# Patient Record
Sex: Male | Born: 1955 | Race: Black or African American | Hispanic: No | Marital: Single
Health system: Southern US, Community
[De-identification: ages and names within clinical notes are randomized; demographics above are authoritative.]

---

## 2006-04-10 ENCOUNTER — Other Ambulatory Visit: Payer: Self-pay

## 2006-04-10 ENCOUNTER — Emergency Department: Payer: Self-pay | Admitting: Internal Medicine

## 2008-08-29 ENCOUNTER — Ambulatory Visit: Payer: Self-pay | Admitting: Specialist

## 2010-12-13 ENCOUNTER — Emergency Department: Payer: Self-pay | Admitting: Emergency Medicine

## 2011-03-05 ENCOUNTER — Ambulatory Visit: Payer: Self-pay | Admitting: Internal Medicine

## 2011-07-01 ENCOUNTER — Ambulatory Visit: Payer: Self-pay | Admitting: Orthopedic Surgery

## 2011-10-08 ENCOUNTER — Emergency Department: Payer: Self-pay | Admitting: *Deleted

## 2011-10-09 ENCOUNTER — Emergency Department: Payer: Self-pay | Admitting: Emergency Medicine

## 2011-10-09 LAB — BASIC METABOLIC PANEL
Anion Gap: 8 (ref 7–16)
Calcium, Total: 9 mg/dL (ref 8.5–10.1)
Chloride: 107 mmol/L (ref 98–107)
Co2: 28 mmol/L (ref 21–32)
Creatinine: 0.88 mg/dL (ref 0.60–1.30)
EGFR (African American): >60
EGFR (Non-African Amer.): >60
Glucose: 91 mg/dL (ref 65–99)
Osmolality: 284 (ref 275–301)
Potassium: 4.3 mmol/L (ref 3.5–5.1)
Sodium: 143 mmol/L (ref 136–145)

## 2011-10-09 LAB — CBC WITH DIFFERENTIAL/PLATELET
Basophil #: 0 10*3/uL (ref 0.0–0.1)
Eosinophil %: 0.1 %
HCT: 38.2 % — ABNORMAL LOW (ref 40.0–52.0)
Lymphocyte #: 1.5 10*3/uL (ref 1.0–3.6)
Lymphocyte %: 30 %
MCV: 89 fL (ref 80–100)
Monocyte #: 0.4 x10 3/mm (ref 0.2–1.0)
Monocyte %: 8.3 %
Neutrophil #: 3 10*3/uL (ref 1.4–6.5)
Platelet: 161 10*3/uL (ref 150–440)
RBC: 4.31 10*6/uL — ABNORMAL LOW (ref 4.40–5.90)
WBC: 5 10*3/uL (ref 3.8–10.6)

## 2011-10-09 LAB — URINALYSIS, COMPLETE
Bilirubin,UR: NEGATIVE
Blood: NEGATIVE
Nitrite: NEGATIVE
Ph: 6 (ref 4.5–8.0)
Protein: NEGATIVE
Specific Gravity: 1.012 (ref 1.003–1.030)
Squamous Epithelial: 1

## 2011-11-04 ENCOUNTER — Emergency Department: Payer: Self-pay | Admitting: Internal Medicine

## 2011-11-11 ENCOUNTER — Emergency Department: Payer: Self-pay | Admitting: Emergency Medicine

## 2011-12-01 ENCOUNTER — Emergency Department: Payer: Self-pay | Admitting: *Deleted

## 2011-12-20 ENCOUNTER — Emergency Department: Payer: Self-pay | Admitting: Unknown Physician Specialty

## 2011-12-24 ENCOUNTER — Ambulatory Visit: Payer: Self-pay | Admitting: Neurology

## 2012-01-27 ENCOUNTER — Inpatient Hospital Stay: Payer: Self-pay | Admitting: Internal Medicine

## 2012-01-27 LAB — URINALYSIS, COMPLETE
Bacteria: NONE SEEN
Bilirubin,UR: NEGATIVE
Blood: NEGATIVE
Glucose,UR: NEGATIVE mg/dL (ref 0–75)
Ketone: NEGATIVE
Leukocyte Esterase: NEGATIVE
Nitrite: NEGATIVE
Ph: 6 (ref 4.5–8.0)
Protein: NEGATIVE
RBC,UR: NONE SEEN /HPF (ref 0–5)
Specific Gravity: 1.014 (ref 1.003–1.030)
Squamous Epithelial: 1
WBC UR: 1 /HPF (ref 0–5)

## 2012-01-27 LAB — CBC
HCT: 40.1 % (ref 40.0–52.0)
HGB: 13.1 g/dL (ref 13.0–18.0)
MCH: 29.2 pg (ref 26.0–34.0)
MCHC: 32.5 g/dL (ref 32.0–36.0)
MCV: 90 fL (ref 80–100)
Platelet: 247 10*3/uL (ref 150–440)
RBC: 4.48 10*6/uL (ref 4.40–5.90)
RDW: 14.4 % (ref 11.5–14.5)
WBC: 5.2 10*3/uL (ref 3.8–10.6)

## 2012-01-27 LAB — DRUG SCREEN, URINE

## 2012-01-27 LAB — COMPREHENSIVE METABOLIC PANEL
Alkaline Phosphatase: 137 U/L — ABNORMAL HIGH (ref 50–136)
Anion Gap: 11 (ref 7–16)
Calcium, Total: 8.9 mg/dL (ref 8.5–10.1)
Chloride: 106 mmol/L (ref 98–107)
Co2: 23 mmol/L (ref 21–32)
Creatinine: 0.94 mg/dL (ref 0.60–1.30)
EGFR (African American): >60
EGFR (Non-African Amer.): >60
SGOT(AST): 41 U/L — ABNORMAL HIGH (ref 15–37)
SGPT (ALT): 46 U/L (ref 12–78)
Sodium: 140 mmol/L (ref 136–145)

## 2012-01-27 LAB — TROPONIN I: Troponin-I: <0.02 ng/mL

## 2012-01-27 LAB — TSH: Thyroid Stimulating Horm: 4 u[IU]/mL

## 2012-01-27 LAB — PROTIME-INR
INR: 1
Prothrombin Time: 13.8 secs (ref 11.5–14.7)

## 2012-01-27 LAB — HEMOGLOBIN A1C: Hemoglobin A1C: 5.7 % (ref 4.2–6.3)

## 2012-01-28 LAB — COMPREHENSIVE METABOLIC PANEL
Albumin: 3.4 g/dL (ref 3.4–5.0)
Anion Gap: 5 — ABNORMAL LOW (ref 7–16)
BUN: 8 mg/dL (ref 7–18)
Chloride: 109 mmol/L — ABNORMAL HIGH (ref 98–107)
Co2: 28 mmol/L (ref 21–32)
EGFR (African American): >60
Glucose: 141 mg/dL — ABNORMAL HIGH (ref 65–99)
Osmolality: 284 (ref 275–301)
Potassium: 4.1 mmol/L (ref 3.5–5.1)
SGOT(AST): 36 U/L (ref 15–37)
Sodium: 142 mmol/L (ref 136–145)
Total Protein: 7.1 g/dL (ref 6.4–8.2)

## 2012-01-28 LAB — CBC WITH DIFFERENTIAL/PLATELET
Basophil #: 0.1 10*3/uL (ref 0.0–0.1)
Basophil %: 1 %
Eosinophil #: 0 10*3/uL (ref 0.0–0.7)
Eosinophil %: 0.1 %
HCT: 37.7 % — ABNORMAL LOW (ref 40.0–52.0)
HGB: 12.2 g/dL — ABNORMAL LOW (ref 13.0–18.0)
Lymphocyte #: 1.2 10*3/uL (ref 1.0–3.6)
Lymphocyte %: 17.8 %
MCH: 28.9 pg (ref 26.0–34.0)
Monocyte #: 0.7 x10 3/mm (ref 0.2–1.0)
Neutrophil #: 4.8 10*3/uL (ref 1.4–6.5)
Neutrophil %: 70.2 %
RBC: 4.23 10*6/uL — ABNORMAL LOW (ref 4.40–5.90)
RDW: 14.5 % (ref 11.5–14.5)

## 2012-01-28 LAB — PHENYTOIN LEVEL, TOTAL: Dilantin: 31.4 ug/mL — ABNORMAL HIGH (ref 10.0–20.0)

## 2012-01-29 ENCOUNTER — Ambulatory Visit: Payer: Self-pay | Admitting: Neurology

## 2012-01-30 LAB — BASIC METABOLIC PANEL
BUN: 13 mg/dL (ref 7–18)
Calcium, Total: 8.7 mg/dL (ref 8.5–10.1)
EGFR (African American): >60
EGFR (Non-African Amer.): >60
Glucose: 70 mg/dL (ref 65–99)
Sodium: 141 mmol/L (ref 136–145)

## 2012-01-31 LAB — PHENYTOIN LEVEL, TOTAL: Dilantin: 25.8 ug/mL — ABNORMAL HIGH (ref 10.0–20.0)

## 2012-02-01 LAB — CBC WITH DIFFERENTIAL/PLATELET
Basophil %: 2.5 %
Eosinophil #: 0 10*3/uL (ref 0.0–0.7)
Eosinophil %: 1.6 %
HCT: 37.6 % — ABNORMAL LOW (ref 40.0–52.0)
HGB: 12.9 g/dL — ABNORMAL LOW (ref 13.0–18.0)
Lymphocyte %: 37.2 %
MCHC: 34.2 g/dL (ref 32.0–36.0)
Neutrophil %: 46.9 %
Platelet: 195 10*3/uL (ref 150–440)
RBC: 4.28 10*6/uL — ABNORMAL LOW (ref 4.40–5.90)

## 2012-02-01 LAB — BASIC METABOLIC PANEL
Anion Gap: 8 (ref 7–16)
BUN: 8 mg/dL (ref 7–18)
Calcium, Total: 8.4 mg/dL — ABNORMAL LOW (ref 8.5–10.1)
Chloride: 113 mmol/L — ABNORMAL HIGH (ref 98–107)
Co2: 20 mmol/L — ABNORMAL LOW (ref 21–32)
EGFR (African American): >60
Osmolality: 280 (ref 275–301)

## 2012-02-01 LAB — PHENYTOIN LEVEL, TOTAL: Dilantin: 17.3 ug/mL (ref 10.0–20.0)

## 2012-02-01 LAB — MAGNESIUM: Magnesium: 1.7 mg/dL — ABNORMAL LOW

## 2012-02-02 LAB — BASIC METABOLIC PANEL
Anion Gap: 6 — ABNORMAL LOW (ref 7–16)
BUN: 7 mg/dL (ref 7–18)
Chloride: 113 mmol/L — ABNORMAL HIGH (ref 98–107)
Co2: 22 mmol/L (ref 21–32)
Creatinine: 0.72 mg/dL (ref 0.60–1.30)
EGFR (African American): >60
EGFR (Non-African Amer.): >60
Potassium: 4 mmol/L (ref 3.5–5.1)
Sodium: 141 mmol/L (ref 136–145)

## 2012-02-02 LAB — CBC WITH DIFFERENTIAL/PLATELET
Basophil %: 1.2 %
Eosinophil %: 0.9 %
HCT: 38 % — ABNORMAL LOW (ref 40.0–52.0)
HGB: 12.2 g/dL — ABNORMAL LOW (ref 13.0–18.0)
Lymphocyte #: 1 10*3/uL (ref 1.0–3.6)
MCH: 28.4 pg (ref 26.0–34.0)
MCV: 88 fL (ref 80–100)
Monocyte #: 0.4 x10 3/mm (ref 0.2–1.0)
Neutrophil %: 57 %
RBC: 4.31 10*6/uL — ABNORMAL LOW (ref 4.40–5.90)
RDW: 13.9 % (ref 11.5–14.5)

## 2012-02-03 LAB — CBC WITH DIFFERENTIAL/PLATELET
HCT: 37.4 % — ABNORMAL LOW (ref 40.0–52.0)
Lymphocyte %: 6.2 %
MCHC: 33.6 g/dL (ref 32.0–36.0)
Monocyte #: 0.9 x10 3/mm (ref 0.2–1.0)
Monocyte %: 8.6 %
Neutrophil %: 84.3 %
Platelet: 192 10*3/uL (ref 150–440)
RDW: 14.3 % (ref 11.5–14.5)
WBC: 10.3 10*3/uL (ref 3.8–10.6)

## 2012-02-03 LAB — BASIC METABOLIC PANEL
Calcium, Total: 8.2 mg/dL — ABNORMAL LOW (ref 8.5–10.1)
EGFR (African American): >60
EGFR (Non-African Amer.): >60
Osmolality: 280 (ref 275–301)
Potassium: 3.3 mmol/L — ABNORMAL LOW (ref 3.5–5.1)

## 2012-02-04 LAB — CBC WITH DIFFERENTIAL/PLATELET
Basophil #: 0.1 10*3/uL (ref 0.0–0.1)
Basophil %: 0.5 %
HCT: 33.7 % — ABNORMAL LOW (ref 40.0–52.0)
Lymphocyte #: 1.4 10*3/uL (ref 1.0–3.6)
Lymphocyte %: 10.4 %
MCHC: 34.7 g/dL (ref 32.0–36.0)
MCV: 87 fL (ref 80–100)
Monocyte #: 1.1 x10 3/mm — ABNORMAL HIGH (ref 0.2–1.0)
Monocyte %: 7.8 %
Platelet: 188 10*3/uL (ref 150–440)
RDW: 14.1 % (ref 11.5–14.5)
WBC: 13.7 10*3/uL — ABNORMAL HIGH (ref 3.8–10.6)

## 2012-02-04 LAB — BASIC METABOLIC PANEL
Anion Gap: 7 (ref 7–16)
BUN: 10 mg/dL (ref 7–18)
Chloride: 111 mmol/L — ABNORMAL HIGH (ref 98–107)
Co2: 21 mmol/L (ref 21–32)
Osmolality: 277 (ref 275–301)
Potassium: 3.5 mmol/L (ref 3.5–5.1)

## 2012-02-05 LAB — CBC WITH DIFFERENTIAL/PLATELET
Basophil #: 0.1 10*3/uL (ref 0.0–0.1)
Eosinophil #: 0 10*3/uL (ref 0.0–0.7)
Eosinophil %: 0.2 %
HCT: 31.4 % — ABNORMAL LOW (ref 40.0–52.0)
Lymphocyte #: 1.1 10*3/uL (ref 1.0–3.6)
MCH: 29.3 pg (ref 26.0–34.0)
MCHC: 33.4 g/dL (ref 32.0–36.0)
MCV: 88 fL (ref 80–100)
Monocyte #: 1.1 x10 3/mm — ABNORMAL HIGH (ref 0.2–1.0)
Monocyte %: 8.7 %
Neutrophil #: 9.9 10*3/uL — ABNORMAL HIGH (ref 1.4–6.5)
Neutrophil %: 80.7 %
Platelet: 208 10*3/uL (ref 150–440)
RBC: 3.58 10*6/uL — ABNORMAL LOW (ref 4.40–5.90)
RDW: 13.9 % (ref 11.5–14.5)

## 2012-02-05 LAB — BASIC METABOLIC PANEL
BUN: 10 mg/dL (ref 7–18)
Calcium, Total: 8.7 mg/dL (ref 8.5–10.1)
Co2: 21 mmol/L (ref 21–32)
EGFR (African American): >60
EGFR (Non-African Amer.): >60
Glucose: 124 mg/dL — ABNORMAL HIGH (ref 65–99)
Osmolality: 280 (ref 275–301)
Potassium: 3.5 mmol/L (ref 3.5–5.1)
Sodium: 140 mmol/L (ref 136–145)

## 2012-02-05 LAB — MAGNESIUM: Magnesium: 2.1 mg/dL

## 2012-02-06 ENCOUNTER — Ambulatory Visit: Payer: Self-pay | Admitting: Internal Medicine

## 2012-02-06 LAB — CBC WITH DIFFERENTIAL/PLATELET
Basophil #: 0.1 10*3/uL (ref 0.0–0.1)
Eosinophil #: 0.1 10*3/uL (ref 0.0–0.7)
Eosinophil %: 1 %
HCT: 33.4 % — ABNORMAL LOW (ref 40.0–52.0)
Lymphocyte #: 1.1 10*3/uL (ref 1.0–3.6)
MCH: 29.6 pg (ref 26.0–34.0)
MCHC: 33.7 g/dL (ref 32.0–36.0)
MCV: 88 fL (ref 80–100)
Monocyte #: 0.6 x10 3/mm (ref 0.2–1.0)
Neutrophil #: 4.5 10*3/uL (ref 1.4–6.5)
RDW: 13.9 % (ref 11.5–14.5)

## 2012-02-06 LAB — VANCOMYCIN, TROUGH: Vancomycin, Trough: 15 ug/mL (ref 10–20)

## 2012-03-08 ENCOUNTER — Ambulatory Visit: Payer: Self-pay | Admitting: Internal Medicine

## 2012-03-08 DEATH — deceased

## 2014-02-23 IMAGING — CT CT HEAD WITHOUT CONTRAST
1 of 3 series · 15 of 30 positions shown, 19 images · non-contrast
Comparison: none

REASON FOR EXAM: s/p fall
COMMENTS:

PROCEDURE:     CT  - CT HEAD WITHOUT CONTRAST  - October 09, 2011  [DATE]
RESULT:     Comparison:  10/08/2011
TECHNIQUE: Multiple axial images from the foramen magnum to the vertex were
obtained without IV contrast.

[Series 10: without · axial · non-contrast · 0.44mm/px · z∈[-142,-13]mm · 15 of 30 slices shown, 19 images]
[im 2/30  brain]
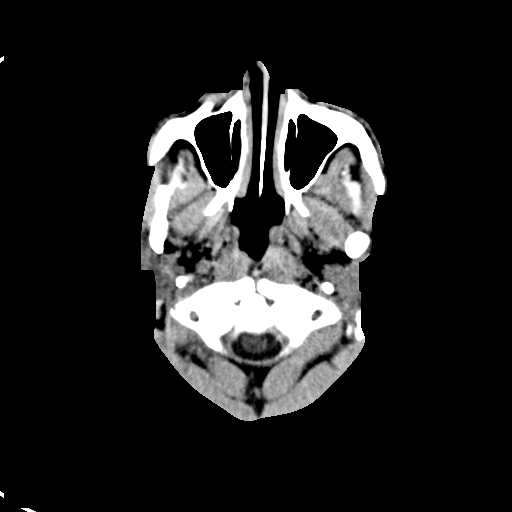
[im 2/30  bone]
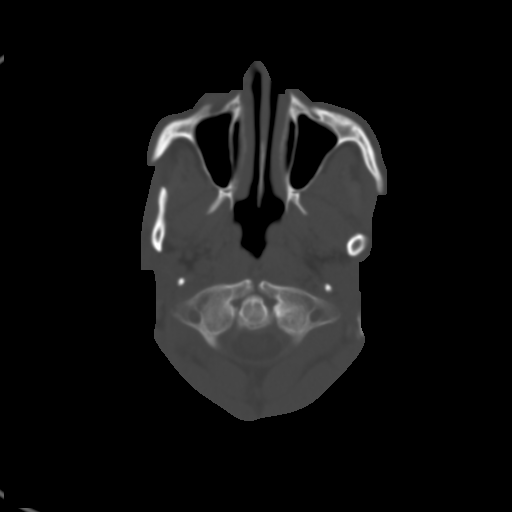
[im 4/30  brain]
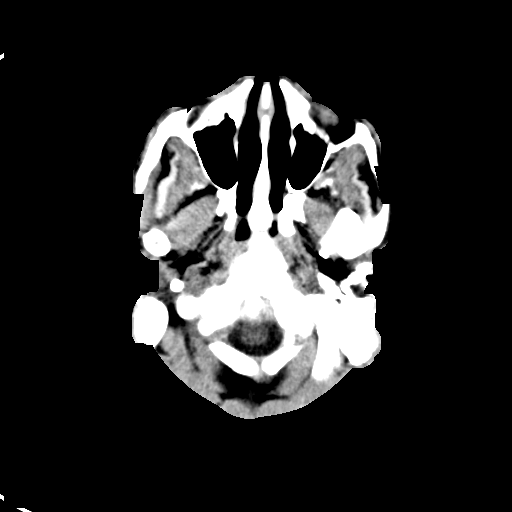
[im 7/30  brain]
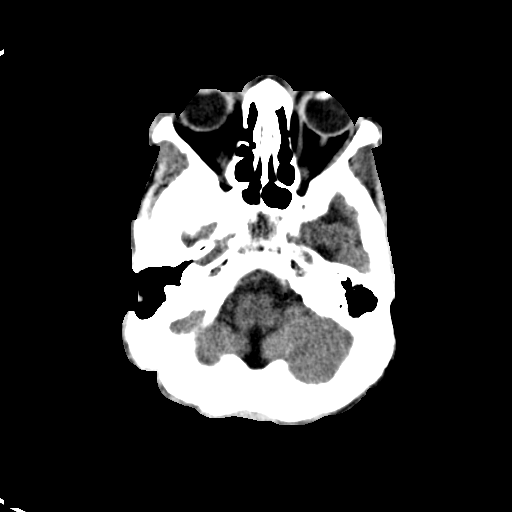
[im 8/30  brain]
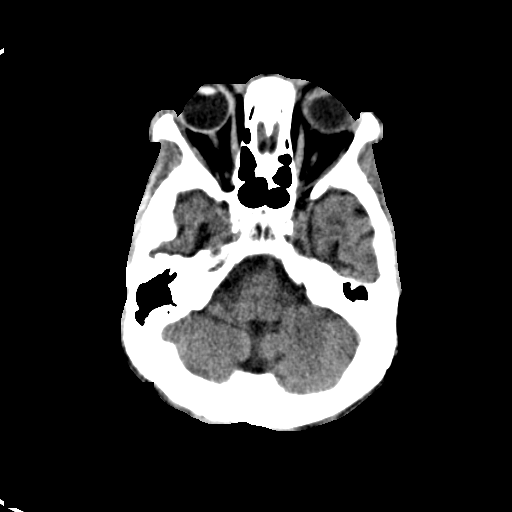
[im 10/30  brain]
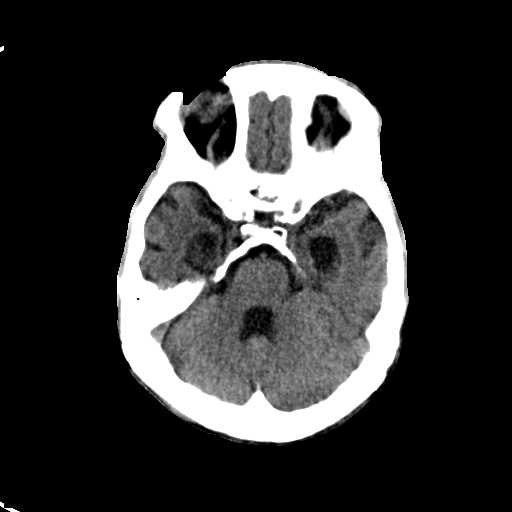
[im 10/30  bone]
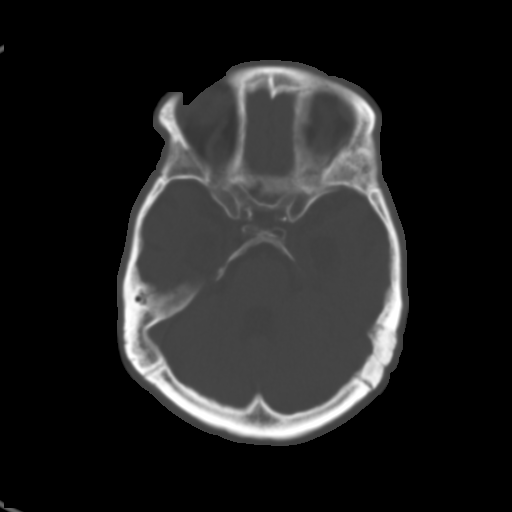
[im 11/30  brain]
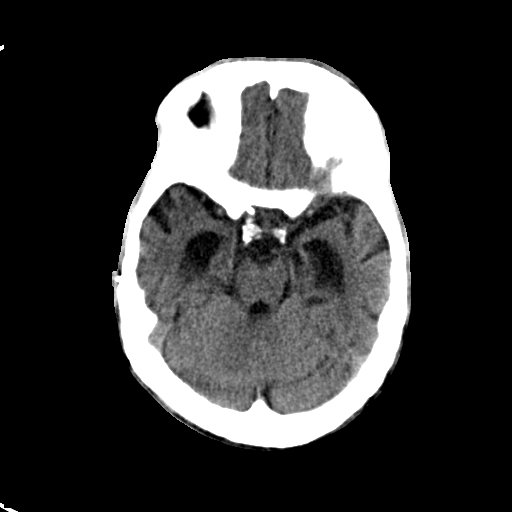
[im 13/30  brain]
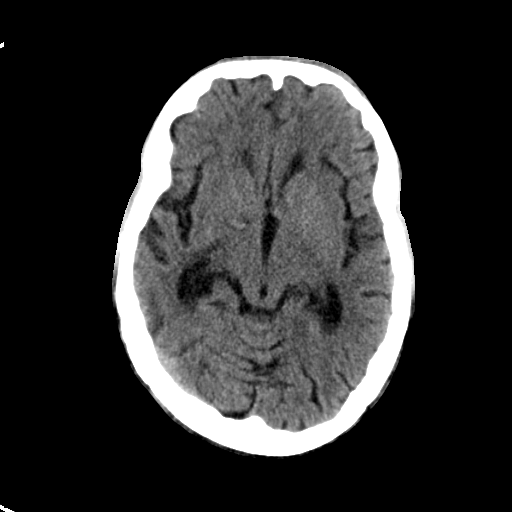
[im 16/30  brain]
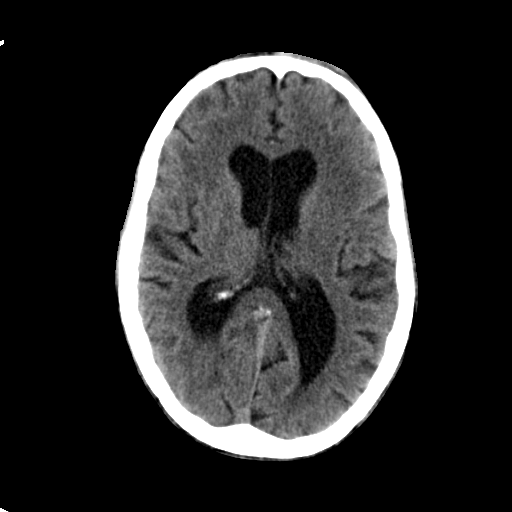
[im 17/30  brain]
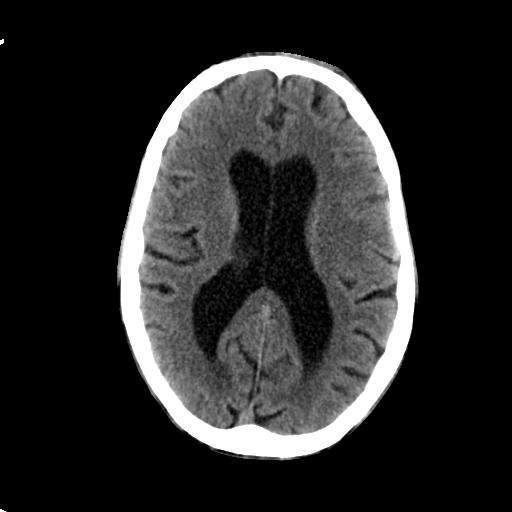
[im 17/30  bone]
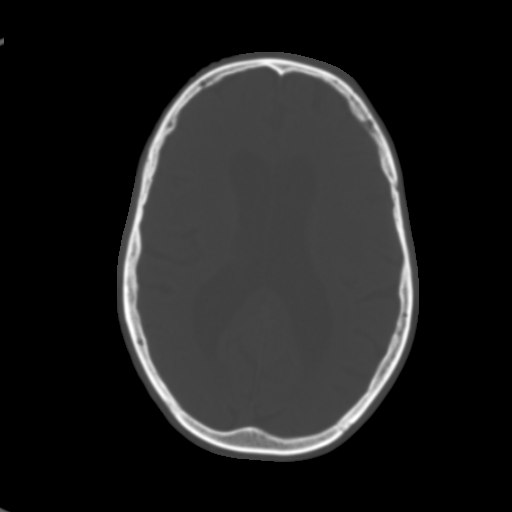
[im 19/30  brain]
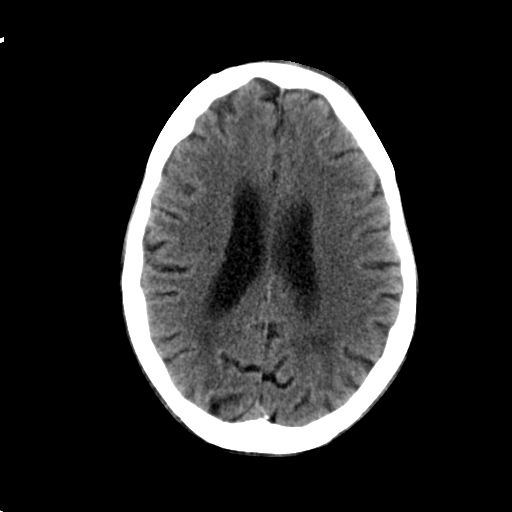
[im 20/30  brain]
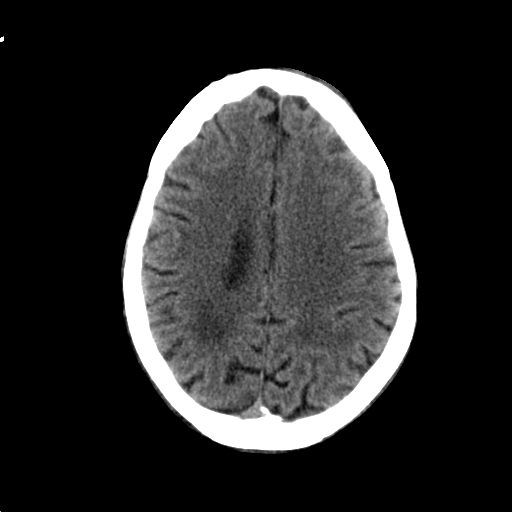
[im 22/30  brain]
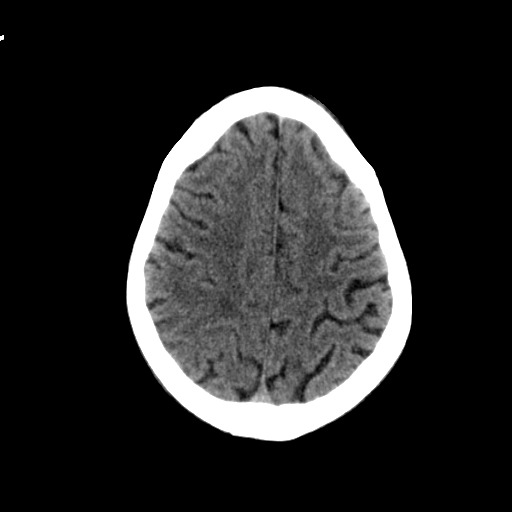
[im 25/30  brain]
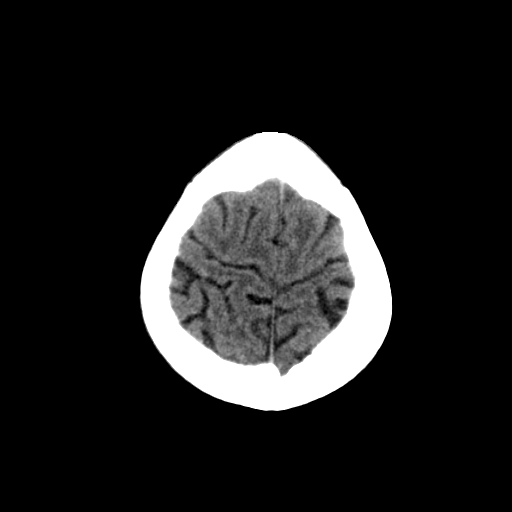
[im 25/30  bone]
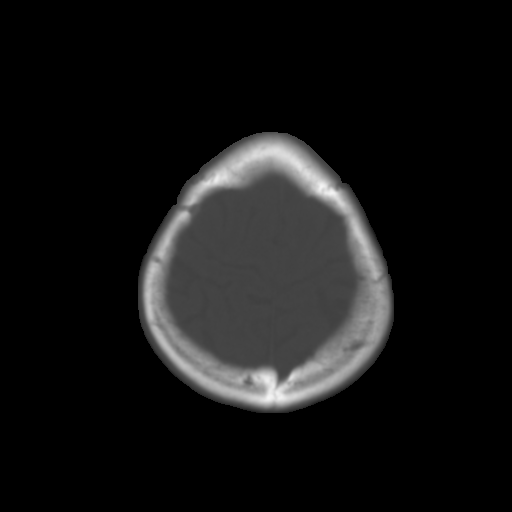
[im 26/30  brain]
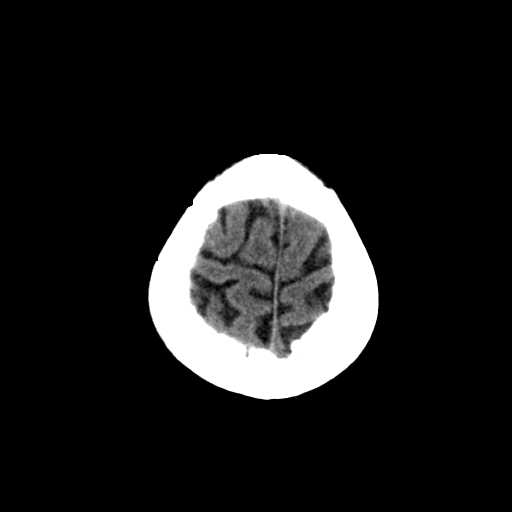
[im 28/30  brain]
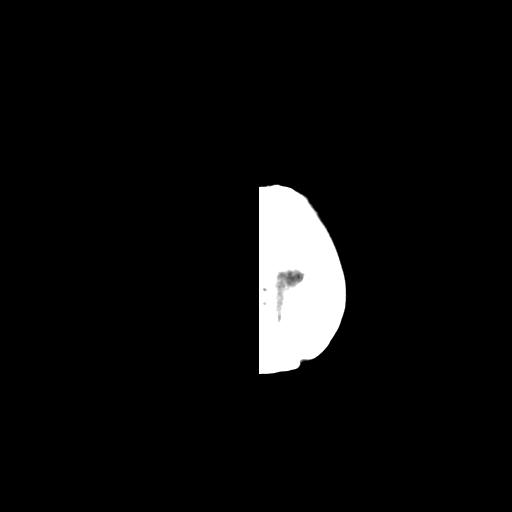

[15 of 30 positions shown; findings below may reference images not displayed]

FINDINGS: There is no evidence for mass effect, midline shift, or extra-axial fluid
collections. There is no evidence for space-occupying lesion, intracranial
hemorrhage, or cortical-based area of infarction. Mild patchy subcortical
hypoattenuation is likely sequela of chronic microangiopathy. There is soft
tissue swelling and hematoma along the frontal scalp, slightly decreased
from prior.

There is congenital nonfusion of the posterior and anterior arch of C1.
There are changes in the bilateral mastoid air cells which may represent
sequela of prior mastoidectomy. Correlate clinically.
IMPRESSION: 1. No acute intracranial process.
2. Chronic microangiopathy.
3. There are changes and bilateral mastoid air cells, which may represent
sequela of prior mastoidectomy. Correlate clinically.

[REDACTED]

## 2014-03-21 IMAGING — CR DG HAND COMPLETE 3+V*L*
1 series · 3 of 3 positions shown · non-contrast
Comparison: none

REASON FOR EXAM: l hand pain
COMMENTS:

[Series 1: pa · 0.17mm/px · 3 of 3 slices shown]
[im 1/3]
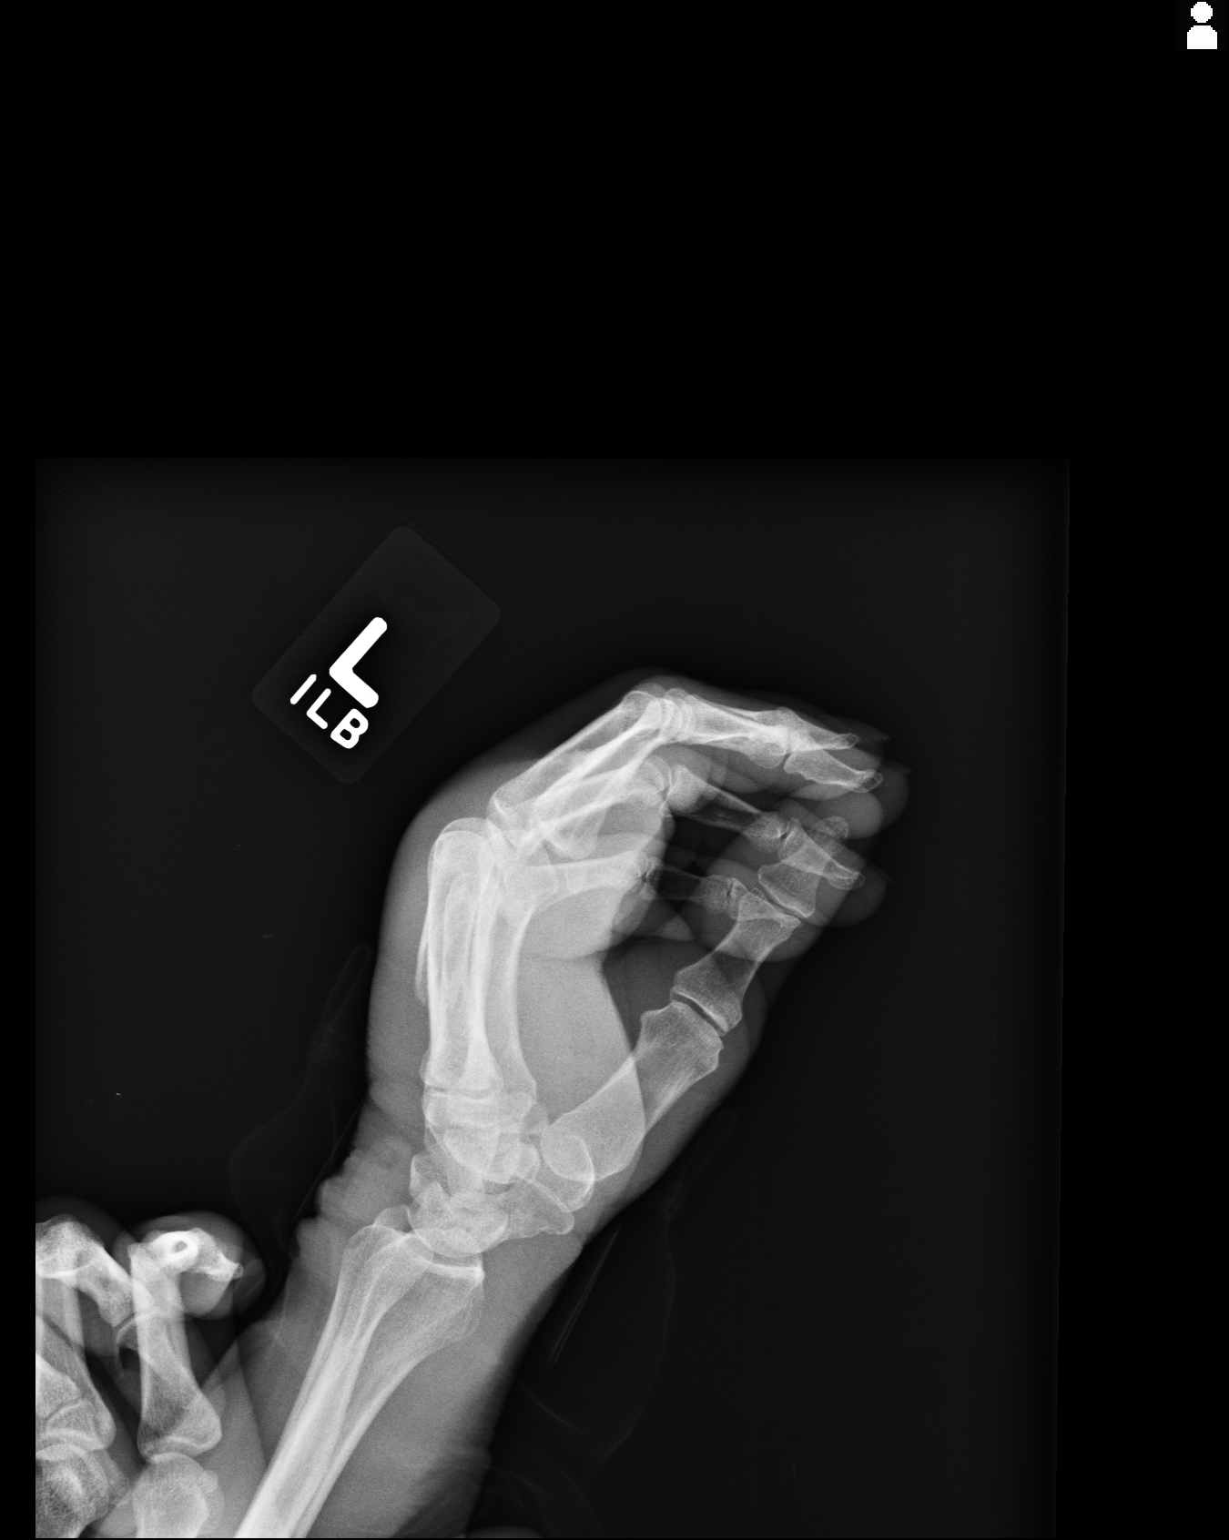
[im 2/3]
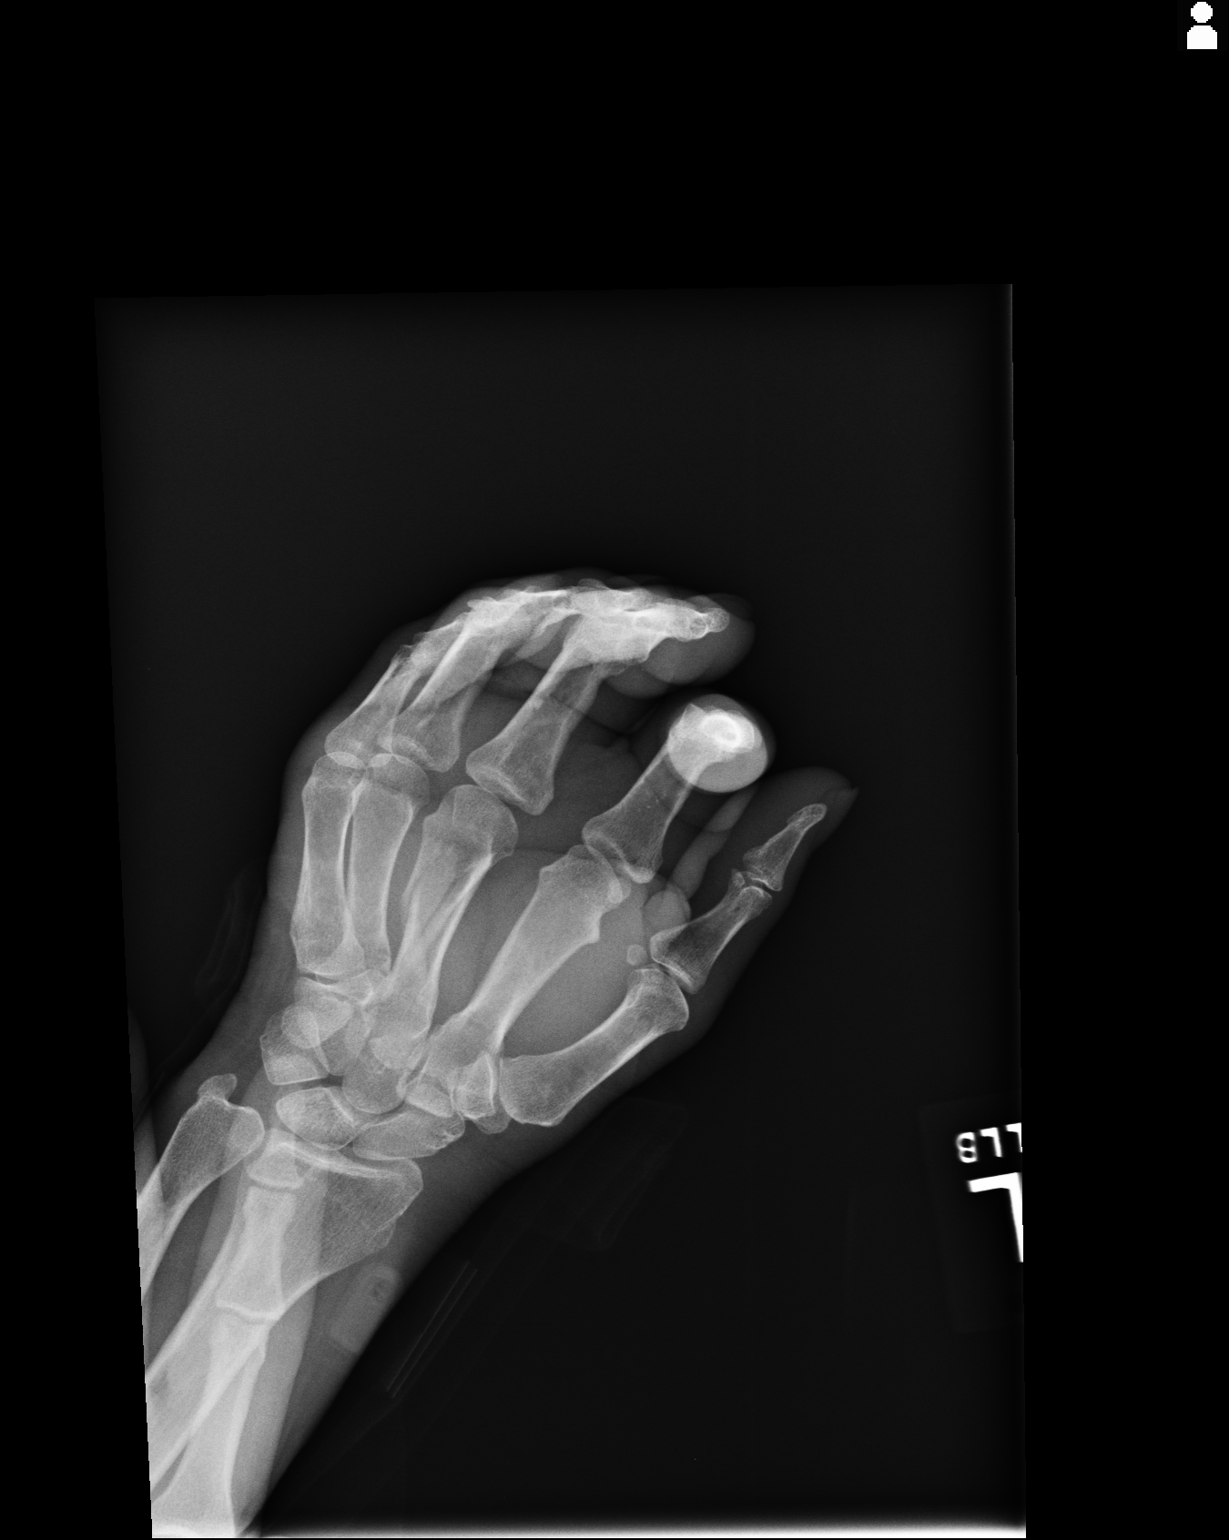
[im 3/3]
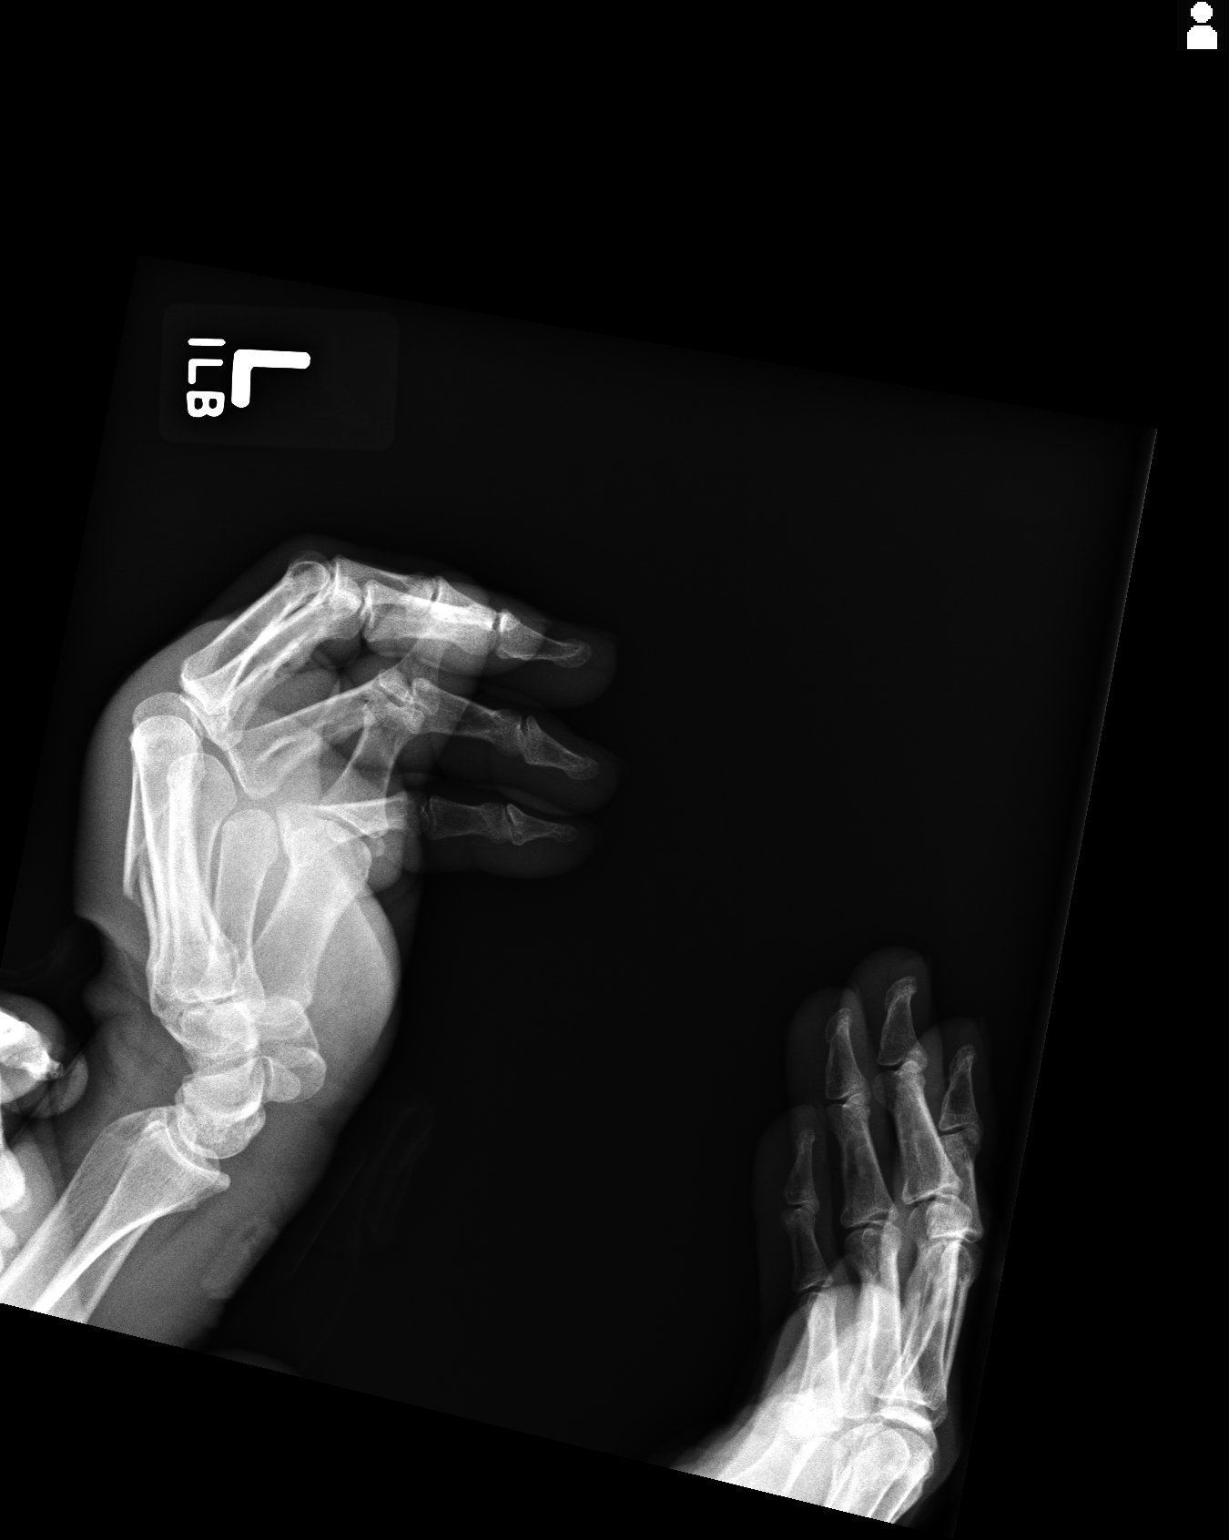

[3 of 3 positions shown; findings below may reference images not displayed]

PROCEDURE:     DXR - DXR HAND LT COMPLETE  W/OBLIQUES  - November 04, 2011  [DATE]

RESULT:     The patient is unable to fully straighten the fingers and
cooperate. The patient's caregiver helped to hold the extremity in position.
There is some overlap of the patient's upper extremity with the wrist region
of the patient's left hand. The study shows a fracture within the third
metacarpal with dorsal angulation. No comminution is seen.
IMPRESSION: Left third metacarpal fracture.

[REDACTED]

## 2014-06-25 NOTE — Consult Note (Signed)
Referring Physician:  Theodoro Grist :   Primary Care Physician:  Sherilyn Cooter, [address] [place], [place] [address], [phone]  Reason for Consult:  Admit Date: 27-Jan-2012   Chief Complaint: seizures   Reason for Consult: seizure   History of Present Illness:  History of Present Illness:   HISTORY OF PRESENT ILLNESS: African American man with a past medical history of static encephalopathy presented to [facility] today because of multiple witnessed seizures.   sister is at bedside and provides the history.  He had multiple seizures this AM at his living facility called [facility].  She believes he had 3-4 seizures total.  These seizures were GTC events.  The patient was seen with full body shaking, the eyes rolled back in the head, and he became very confused.  He had another seizure in the ambulance on the way to the hospital.  Since then he has been drowsy all day.  No additional seizures after being loaded on fosphenytoin.  He is unable to take oral pills.  Nothing seemed to provoke the seizures.  They resolved on their own.  He has not previously been taking any AEDs.   MEDICAL HISTORY:  1. History of trigger finger release in 2010 severe tenosynovitis with flexure contraction of his trigger finger.  2. History of constipation. Osteoarthritis. Insomnia. Anxiety, depression. Questionable sinus problems and cough based on medications according to his MR.  Also history of operation of both ears at [facility] approximately 15 to 20 years ago. 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 No other surgeries.   ALLERGIES: No known drug allergies.  Patient's medication list from facility is as follows:  1. Tylenol 325 mg 1 tablet every six hours as needed for pain or high fevers. 2. Aspirin 81 mg p.o. daily. 0.1% nasal spray two sprays to each nostril twice a day. Citalopram 20 mg p.o.  daily.  Docusate 150 mg/15 mL solution 10 mL twice a day. Exelon patch 13.3 in 24 hours once a day. Mucinex 600 mg p.o. twice daily. Trazodone 150 mg p.o. at bedtime. Vitamin D 50,000 units orally weekly. Vitamin D3 1000 units once daily. Imodium 2 mg p.o. every six hours as needed for each loose stool, not to exceed eight doses in 24 hours. Guaifenesin 100 mg/5 mL solution two teaspoonfuls every six hours as needed for cough.  Ibuprofen 200 mg p.o. every six hours as needed.  Mapap 500 mg p.o. every 4 hours as needed for pain. Milk of magnesia 30 mL once at bedtime as needed.  Mylanta suspension 30 mL orally as needed for heartburn, not more than four doses in 24 hours.  Nitrostat 0.4 mg sublingually as needed for chest pains. Pseudoephedrine  30 mL 1 tablet every six hours as needed  HISTORY: No history of early coronary artery disease, diabetes, strokes except of patient's brother had stroke at age of 70, parents had Alzheimer?s disease, paternal uncle had lung cancer, he was smoker.  HISTORY: Patient is single. No children. No smoking, alcohol abuse. Lives in assisted living facility which is called [facility]. OF SYSTEMS: Not available because patient is very lethargic and also due to moderate static encephalopathy.   GENERAL: NAD.  Normocephalic and atraumatic. exam without pharmacologic dilation shows normal disc size, appearance and C/D ratio.  There is no papilledema. and S2 sounds are within normal limits, without murmurs, gallops, or rubs. -  Normal- NormalDrift ? Unable to test because patient is very lethargic and also due to moderate static encephalopathy.- Unable to test because patient is very lethargic and also due to moderate static encephalopathy.- Absent ? patient is able to move all four extremities spontaneously. STATUS:static encephalopathy noted.  Unable to test because patient is very lethargic and also due to moderate static encephalopathy. NERVES:to test because patient is  very lethargic and also due to moderate static encephalopathy he cannot follow commands. to noxious stim in all four extremities.    Biceps   Brachioradialis    Patellar   Achilles  Unable to test because patient is very lethargic and also due to moderate static encephalopathy.  ASSESSMENT/RECS 59 year old [place] American man with a past medical history of static encephalopathy presented to [facility] today because of multiple witnessed seizures.   this point I would agree with maintaining him on fosphenytoin or crushed phenytoin 100 mg tid maintenance dose.  An EEG had been attempted as an outpatient but he was unable to tolerate it.  Seizures are a clinical diagnosis and the events today are convincing for seizures.  As such, further EEG can be attempted if the patient continues to be somnolent, but is not imperative for treatment purposes if he is unable to tolerate it.  I would consider changing him to a better long term AED such as Keppra 500 mg bid as an outpatient but staying on the Dilantin for now is reasonable to ensure stabilization.   to the cause of worsening seizure, the patient does not have any significant lab abnormalities.  There are no infections or recent trauma.  His HCT shows ventriculomegaly which could suggest IIH versus NPH.  Would recommend a spinal tap if tolerated to obtain basic CSF viral panel and basic CSF labs and to evaluate for opening pressure.    Gurney Maxin, MD      Past Medical/Surgical Hx:  Mental Retardation:   HTN:   Insomnia:   Osteoarthritis:   Down's Syndrome:   Dementia:   Home Medications: Medication Instructions Last Modified Date/Time  Mucinex 600 mg oral tablet, extended release 1 tab(s) orally 2 times a day X 7 DAYS 21-Nov-13 13:29  guaifenesin 100 mg/5 mL oral liquid 10 milliliter(s) (2 teaspoons) orally every 6 hours, As Needed for cough (do not exceed 4 doses in 24 hours) 21-Nov-13 13:29  ibuprofen 200 mg oral tablet 1 tab(s) orally  every 6 hours, As Needed- for Pain  21-Nov-13 13:29  Milk of Magnesia 8% oral suspension 30 milliliter(s) orally once a day (at bedtime), As Needed- for Constipation  21-Nov-13 13:29  Nitrostat 0.4 mg sublingual tablet 1 tab(s) sublingual every 5 minutes, As Needed- for Chest Pain  21-Nov-13 13:29  acetaminophen 325 mg oral tablet 1 tab(s) orally every 6 hours, As Needed- for Pain  21-Nov-13 13:29  aspirin 81 mg oral tablet, chewable 1 tab(s) orally once a day 21-Nov-13 13:29  citalopram 20 mg oral tablet 1 tab(s) orally once a day 21-Nov-13 13:29  Docu 10 milliliter(s) orally 2 times a day 21-Nov-13 13:29  Exelon 13.3 mg/24 hr transdermal film, extended release 1 patch transdermal once a day  21-Nov-13 13:29  trazodone 150 mg oral tablet 1 tab(s) orally once a day (at bedtime) 21-Nov-13 13:29  Vitamin D3 1000 intl units oral tablet 1 tab(s) orally once a day 21-Nov-13 13:29  Vitamin D3 50,000 intl units oral capsule 1 cap(s) orally once a week 21-Nov-13 13:29  loperamide 2 mg oral tablet 1 tab(s)  orally , As Needed- for Diarrhea  21-Nov-13 13:29  Mapap 500 mg oral tablet 1 tab(s) orally every 4 hours, As Needed- for Pain or temp 21-Nov-13 13:29  Mylanta 30 milliliter(s) orally , As Needed- for Indigestion, Heartburn  21-Nov-13 13:29  pseudoephedrine 30 mg oral tablet 1 tab(s) orally every 6 hours, As Needed 21-Nov-13 13:29   Allergies:  No Known Allergies:   Vital Signs: **Vital Signs.:   21-Nov-13 20:39   Vital Signs Type Routine   Temperature Temperature (F) 97.8   Celsius 36.5   Temperature Source Oral   Pulse Pulse 67   Respirations Respirations 18   Systolic BP Systolic BP 045   Diastolic BP (mmHg) Diastolic BP (mmHg) 68   Mean BP 86   Pulse Ox % Pulse Ox % 96   Pulse Ox Activity Level  At rest   Oxygen Delivery Room Air/ 21 %   Lab Results: Thyroid:  21-Nov-13 11:15    Thyroid Stimulating Hormone 4.00 (0.45-4.50 (International Unit)  ----------------------- Pregnant  patients have  different reference  ranges for TSH:  - - - - - - - - - -  Pregnant, first trimetser:  0.36 - 2.50 uIU/mL)  Hepatic:  21-Nov-13 11:15    Bilirubin, Total 0.4   Alkaline Phosphatase  137   SGPT (ALT) 46   SGOT (AST)  41   Total Protein, Serum 7.4   Albumin, Serum 3.6  Routine Chem:  21-Nov-13 11:15    Hemoglobin A1c ([facility]) 5.7 (The American Diabetes Association recommends that a primary goal of therapy should be <7% and that physicians should reevaluate the treatment regimen in patients with HbA1c values consistently >8%.)   Glucose, Serum  112   BUN 9   Creatinine (comp) 0.94   Sodium, Serum 140   Potassium, Serum 3.7   Chloride, Serum 106   CO2, Serum 23   Calcium (Total), Serum 8.9   Osmolality (calc) 279   eGFR (African American) >60   eGFR (Non-African American) >60 (eGFR values <76mL/min/1.73 m2 may be an indication of chronic kidney disease (CKD). Calculated eGFR is useful in patients with stable renal function. The eGFR calculation will not be reliable in acutely ill patients when serum creatinine is changing rapidly. It is not useful in  patients on dialysis. The eGFR calculation may not be applicable to patients at the low and high extremes of body sizes, pregnant women, and vegetarians.)   Anion Gap 11    14:56    Ammonia, Plasma 27 (Result(s) reported on 27 Jan 2012 at 03:50PM.)  Urine Drugs:  40-JWJ-19 14:78    Tricyclic Antidepressant, Ur Qual (comp) NEGATIVE (Result(s) reported on 27 Jan 2012 at 02:18PM.)   Amphetamines, Urine Qual. NEGATIVE   MDMA, Urine Qual. NEGATIVE   Cocaine Metabolite, Urine Qual. NEGATIVE   Opiate, Urine qual NEGATIVE   Phencyclidine, Urine Qual. NEGATIVE   Cannabinoid, Urine Qual. NEGATIVE   Barbiturates, Urine Qual. NEGATIVE   Benzodiazepine, Urine Qual. NEGATIVE (----------------- The URINE DRUG SCREEN provides only a preliminary, unconfirmed analytical test result and should not be used for non-medical   purposes.  Clinical consideration and professional judgment should be  applied to any positive drug screen result due to possible interfering substances.  A more specific alternate chemical method must be used in order to obtain a confirmed analytical result.  Gas chromatography/mass spectrometry (GC/MS) is the preferred confirmatory method.)   Methadone, Urine Qual. NEGATIVE  Cardiac:  21-Nov-13 11:15    Troponin I < 0.02 (  0.00-0.05 0.05 ng/mL or less: NEGATIVE  Repeat testing in 3-6 hrs  if clinically indicated. >0.05 ng/mL: POTENTIAL  MYOCARDIAL INJURY. Repeat  testing in 3-6 hrs if  clinically indicated. NOTE: An increase or decrease  of 30% or more on serial  testing suggests a  clinically important change)  Routine UA:  21-Nov-13 13:08    Color (UA) Yellow   Clarity (UA) Hazy   Glucose (UA) Negative   Bilirubin (UA) Negative   Ketones (UA) Negative   Specific Gravity (UA) 1.014   Blood (UA) Negative   pH (UA) 6.0   Protein (UA) Negative   Nitrite (UA) Negative   Leukocyte Esterase (UA) Negative (Result(s) reported on 27 Jan 2012 at 01:54PM.)   RBC (UA) NONE SEEN   WBC (UA) 1 /HPF   Bacteria (UA) NONE SEEN   Epithelial Cells (UA) 1 /HPF   Mucous (UA) PRESENT (Result(s) reported on 27 Jan 2012 at 01:54PM.)  Routine Coag:  21-Nov-13 11:15    Prothrombin 13.8   INR 1.0 (INR reference interval applies to patients on anticoagulant therapy. A single INR therapeutic range for coumarins is not optimal for all indications; however, the suggested range for most indications is 2.0 - 3.0. Exceptions to the INR Reference Range may include: Prosthetic heart valves, acute myocardial infarction, prevention of myocardial infarction, and combinations of aspirin and anticoagulant. The need for a higher or lower target INR must be assessed individually. Reference: The Pharmacology and Management of the Vitamin K  antagonists: the seventh ACCP Conference on Antithrombotic  and Thrombolytic Therapy. Chest.2004 Sept:126 (3suppl): L7870634. A HCT value >55% may artifactually increase the PT.  In one study,  the increase was an average of 25%. Reference:  "Effect on Routine and Special Coagulation Testing Values of Citrate Anticoagulant Adjustment in Patients with High HCT Values." American Journal of Clinical Pathology 2006;126:400-405.)  Routine Hem:  21-Nov-13 11:15    WBC (CBC) 5.2   RBC (CBC) 4.48   Hemoglobin (CBC) 13.1   Hematocrit (CBC) 40.1   Platelet Count (CBC) 247 (Result(s) reported on 27 Jan 2012 at 11:34AM.)   MCV 90   MCH 29.2   MCHC 32.5   RDW 14.4   Radiology Results: CT:    21-Nov-13 11:21, CT Head Without Contrast   CT Head Without Contrast    REASON FOR EXAM:    ams  COMMENTS:   May transport without cardiac monitor    PROCEDURE: CT  - CT HEAD WITHOUT CONTRAST  - Jan 27 2012 11:21AM     RESULT: History: Seizure.    Comparison Study: Prior CT of 12/01/2011.    Findings: Standard nonenhancedCT obtained. No mass. Ventriculomegaly is   present. A process such as normal pressure hydrocephalus cannot be   excluded. Mild white matter changes noted most consistent with chronic   white matter ischemia. No acute bony abnormality identified. Again the   calcification present. Previously identified mild  soft tissue swelling   of the left frontal scalp has almost completely cleared.  IMPRESSION:  Ventriculomegaly. A process such as normal pressure   hydrocephalus cannot be excluded. Neurologic consultation suggested.    Mild white matter changes noted suggesting chronic ischemia.        Verified By: Gwynn Burly, M.D., MD   Electronic Signatures: Morene Crocker (MD)  (Signed [phone] 00:15)  Authored: REFERRING PHYSICIAN, Primary Care Physician, Consult, History of Present Illness, PAST MEDICAL/SURGICAL HISTORY, HOME MEDICATIONS, ALLERGIES, NURSING VITAL SIGNS, LAB RESULTS, RADIOLOGY RESULTS  Last Updated: 22-Nov-13  00:15 by Anabel Bene (MD)

## 2014-06-25 NOTE — H&P (Signed)
PATIENT NAME:  Annice NeedyMILLER, Ayush J MR#:  [id number] DATE OF BIRTH:  1955-12-21  DATE OF ADMISSION:  01/27/2012  PRIMARY CARE PHYSICIAN: Dr. Maryellen PileEason   HISTORY OF PRESENT ILLNESS: Patient is a 59 year old African American male with past medical history significant for history of mental retardation since childhood who presented to the hospital with complaints of recurrent seizures. Apparently patient's seizure started approximately 1 to 2 months ago. He has been having small seizures which are of unclear definition at this point, however, patient's brother who is present during my interview is able just to tell me that those were small seizures initially, however, the seizures seem to be progressing in quality now yesterday as well as today in the morning he was having tonic-clonic seizures. Patient was very somnolent yesterday after seizures. He also had recurrent seizures today in the morning as well as one seizure happened while he was driven by EMS to Emergency Room. Seizure was short lived and apparently ended by itself. No medications were given, however. Since seizure episode patient is not waking up. He is very still, remains very somnolent. However, according to patient's brother he has been having problems with changing in facial color apparently intermittently. Yesterday he was also holding food in his mouth and not swallowing. He also has history of dysphagia. Apparently he has been taking only crushed pills. He is able to swallow them whole. Hospitalist services were contacted for admission. Fosphenytoin is ordered for patient to be infused now for his recurrent seizures.   PAST MEDICAL HISTORY:  1. History of trigger finger release in 2010 severe tenosynovitis with flexure contraction of his trigger finger.  2. History of constipation. 3. Osteoarthritis. 4. Insomnia. 5. Anxiety, depression. 6. Questionable sinus problems and cough based on medications according to his MR.  7. Also [facility] 15 to 20 years ago. 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 No other surgeries.   ALLERGIES: No known drug allergies.   MEDICATIONS: Patient's medication list from facility is as follows:  1. Tylenol 325 mg 1 tablet every six hours as needed for pain or high fevers. 2. Aspirin 81 mg p.o. daily. 4. Citalopram 20 mg p.o. daily.  5. Docusate 150 mg/15 mL solution 10 mL twice a day. 6. Exelon patch 13.3 in 24 hours once a day. 7. Mucinex 600 mg p.o. twice daily. 8. Trazodone 150 mg p.o. at bedtime. 9. Vitamin D 50,000 units orally weekly. 10. Vitamin D3 1000 units once daily. 11. Imodium 2 mg p.o. every six hours as needed for each loose stool, not to exceed eight doses in 24 hours. 12. Guaifenesin 100 mg/5 mL solution two teaspoonfuls every six hours as needed for cough.  13. Ibuprofen 200 mg p.o. every six hours as needed.  14. Mapap 500 mg p.o. every 4 hours as needed for pain. 15. Milk of magnesia 30 mL once at bedtime as needed.  16. Mylanta suspension 30 mL orally as needed for heartburn, not more than four doses in 24 hours.  17. Nitrostat 0.4 mg sublingually as needed for chest pains. 18. Pseudoephedrine  30 mL 1 tablet every six hours as needed   FAMILY HISTORY: No history of early coronary artery disease, diabetes, strokes except of patient's brother had stroke at age of 59, parents had Alzheimer's disease, paternal uncle had lung cancer, he was smoker.   SOCIAL HISTORY: Patient is single.  No children. No smoking, alcohol abuse. Lives in assisted living facility which is called [facility].  REVIEW OF SYSTEMS: Not available as patient is poorly responsive.   PHYSICAL EXAMINATION: VITAL SIGNS: On arrival to the Emergency Room patient's vital signs: Temperature 98.6, pulse 83, respiration rate 18, blood pressure 115/64, saturation 96% on room air.    GENERAL: Well-nourished, thin African American male laying on the stretcher. He is somewhat more arousable now. He is able to open his eyes briefly, however, does not converse.  HEENT: His pupils are equal, reactive to light. Extraocular movements are intact. No icterus or conjunctivitis. Not able to assess his hearing. No pharyngeal erythema. Mucosa is moist.    NECK: No masses, supple, nontender. Thyroid not enlarged. No adenopathy or JVD or carotid bruits bilaterally. Full range of motion. Patient has small jaw and protruded tongue.   CARDIOVASCULAR: 3/6 systolic murmur heard, no significant radiation in precordium. Chest is nontender to palpation. 1+ pedal pulses. No lower extremity edema, calf tenderness, or cyanosis noted.   ABDOMEN: Soft, nontender. Bowel sounds are present. No hepatosplenomegaly or masses were noted.   RECTAL: Deferred.   LUNGS: Clear to auscultation in all fields anteriorly. No rales, rhonchi, diminished breath sounds or wheezing. No labored respirations, increased effort, dullness to percussion, overt respiratory distress. Patient is able to yawn intermittently.   MUSCLE STRENGTH: Not able to assess his muscle strength, however, he is able to withdraw to pain and is able to move all four extremities. No cyanosis, degenerative joint disease. Not able to assess for kyphosis. Gait is not tested.   SKIN: Skin did not reveal any rashes, lesions, erythema, nodularity, induration. It was warm and dry to palpation.   LYMPH: No adenopathy in cervical region.   NEUROLOGICAL: Cranial nerves grossly intact. Sensory not able to assess. No Babinski. Not able to assess for dysarthria. Patient is somnolent, not oriented, not cooperative.   LABORATORY, DIAGNOSTIC AND RADIOLOGICAL DATA: EKG is not done. Chest x-ray, portable, single view, 01/27/2012 showed no acute cardiopulmonary disease. CT of head with no contrast 01/27/2012 revealed ventriculomegaly, process such as normal  pressure hydrocephalus cannot be excluded. Neurology consultation was suggested. Mild white matter changes noted suggesting chronic ischemia. BMP showed glucose 112, otherwise unremarkable. Patient's liver enzymes revealed elevated AST to 41 and elevated alkaline phosphatase 137. Patient's troponin less than 0.02. Urine drug screen was negative. CBC within normal limits with white blood cell count 5.2, hemoglobin 13.1, platelet count 247. Urinalysis: Yellow hazy urine, negative for glucose, bilirubin or ketones, specific gravity 1.014, pH 6.0, negative for blood, protein, nitrites or leukocyte esterase, no red blood cells, 1 white blood cell, no bacteria, 1 epithelial cell, mucous is present.   ASSESSMENT AND PLAN:  1. Seizure disorder. Admit patient to medical floor. Get MRI of his brain as well as electroencephalogram. Get neurology consultation. Continue patient on fosphenytoin IV. Check Dilantin level in the morning.  2. Increased intracerebral pressure. Start patient on Diamox as soon as patient is able to take p.o. pills after speech evaluation.  3. Elevated transaminases of unclear etiology. Get ultrasound of liver done.  4. Mental retardation. Continue supportive therapy.  5. Altered mental status, likely postictal. Will get ABGs. Also will get speech therapist evaluation because of his elevated liver enzymes. Will also get ammonia level to rule out any other etiologies.  6. Hyperglycemia. Get hemoglobin A1c.   7. Cardiac murmur of unclear etiology. Get echocardiogram.   TIME SPENT: One hour.   ____________________________ Dallas Breeding  Winona Legato, MD rv:cms D: 01/27/2012 14:35:59 ET T: 01/27/2012 15:41:50 ET JOB#: [id number]  cc: Katharina Caper, MD, <Dictator> Serita Sheller. Maryellen Pile, MD Katharina Caper MD ELECTRONICALLY SIGNED 02/19/2012 19:31

## 2014-06-25 NOTE — Consult Note (Signed)
PATIENT NAME:  Philip Carroll, Philip Carroll MR#:  [id number] DATE OF BIRTH:  05-15-1955  DATE OF CONSULTATION:  01/29/2012  REFERRING PHYSICIAN:   CONSULTING PHYSICIAN:  Pauletta BrownsYuriy Kyros Salzwedel, MD  HISTORY OF PRESENTING ILLNESS: This is a 59 year old African American male with past medical history of static encephalopathy, history of Downs, history of dementia, who presented with multiple witnessed seizures and was seen initially by consultation by Dr. Malvin JohnsPotter. Information was obtained through chart and the patient's family. His sister and brother were at bedside and stated that he had multiple falls in the recent past, but came in here because of 3 to 4 episodes of generalized tonic-clonic seizures. In the hospital he was loaded with fosphenytoin and is on fosphenytoin 100 mg 3 times a day. As per family he is still not back to his baseline, more somnolent and more confused than he has previously been prior to coming in here. On review of his records, it does appear that his phenytoin level is high 31.4 total.   PAST MEDICAL HISTORY:  1. He has a trigger finger that was released in 2010.  2. History of constipation. 3. History of osteoporosis. 4. Insomnia. 5. Anxiety. 6. Depression.  7. He has history of ear surgeries about 15 to 20 years ago.   PHYSICAL EXAMINATION:    NEUROLOGICAL: The patient does not follow commands, but he does respond to painful stimuli. He opens his eyes to painful stimuli and after that he tries to verbalize to his family. On cranial nerve examination, no signs of nystagmus present that would suggest Dilantin toxicity. He appears to respond to bilateral visual threats. On sensation he does respond to noxious stimuli in all four extremities and they are symmetrical withdrawal. Reflexes 2+ throughout, coordination unable to assess.   ASSESSMENT: This is a 59 year old African American male with past medical history of static encephalopathy who presented with multiple seizures. He was loaded with  fosphenytoin and restarted on phenytoin 100 t.i.d. He had work-up that included MRI that showed ventriculomegaly with generalized atrophy, as well as an EEG that showed generalized slowing and no active seizure activity.   PLAN: Due to elevated Dilantin level, please hold Dilantin as of right now. Do not give any further doses and start on Keppra. He is a gentleman that weighs a little under 100 pounds, so would start Keppra at 500 mg twice a day. If his mental status does improve and it appears based on his imaging, there is possibly history of NPH, which is normal pressure hydrocephalus, would perform a large volume tap to see if there is any improvement in his mental status, but this does not have to be done acutely as this is a diagnosis of exclusion.   Thank you. It was a pleasure seeing this patient.     ____________________________ Pauletta BrownsYuriy Roma Bierlein, MD yz:ap D: 01/29/2012 12:51:09 ET T: 01/29/2012 14:04:02 ET JOB#: [id number]  cc: Pauletta BrownsYuriy Adysen Raphael, MD, <Dictator> Pauletta BrownsYURIY Berlene Dixson MD ELECTRONICALLY SIGNED 02/22/2012 19:14

## 2014-06-25 NOTE — Discharge Summary (Signed)
PATIENT NAME:  Philip Carroll, Philip Carroll MR#:  [id number] DATE OF BIRTH:  July 04, 1955  DATE OF ADMISSION:  01/27/2012 DATE OF DISCHARGE:  02/07/2012  ADDENDUM to the discharge summary dictated by Dr. Mordecai MaesSanchez on 12/01. Please see interim summary dictated by Dr. Mordecai MaesSanchez.  PRIMARY CARE PHYSICIAN: Dr. Maryellen PileEason   FINAL DIAGNOSES:  1. Aspiration pneumonia. 2. New onset seizures. 3. Encephalopathy. 4. Failure to thrive. 5. Elevated liver function tests. 6. Hypokalemia. 7. Mental retardation.   MEDICATIONS ON DISCHARGE TO THE HOSPICE HOME:  1. Roxanol 20 mg/mL 0.25 to 0.5 mL p.o. sublingual every 1 to 2 hours p.r.n.  2. Lorazepam 0.5 to 1 mg p.o. sublingual every 2 to 4 hours p.r.n. agitation, anxiety.  3. ABHR suppository as needed every 4 or 6 hours. 4. Dilantin 300 mg PR at bedtime.   HOSPITAL COURSE: Patient was admitted 01/27/2012. Please see interim summary dictated by Dr. Mordecai MaesSanchez on 12/01 for hospital course up to that point. Patient failed to improve during the hospital course, still very lethargic. Patient was admitted with new onset seizures and was on IV Keppra. He had an aspiration during the hospital course and he is on Zosyn and vancomycin. He had a Dobbhoff tube feeding but continued to aspirate. Repeat chest x-ray on the day of discharge to the hospice home showed pneumonia. Patient failed the swallow evaluation. Patient was seen in consultation by Dr. Harvie JuniorPhifer who recommended hospice home transfer, family was agreeable. Patient was discharged to the hospice home. Antibiotics were stopped. Keppra was switched over to Dilantin rectal to control seizure. Patient will be kept comfortable. Diet as tolerated. I do not expect the patient to survive much longer since he is not responsive.    TIME SPENT ON PATIENT CARE TODAY: 35 minutes.   ____________________________ Herschell Dimesichard Carroll. Renae GlossWieting, MD rjw:cms D: 02/07/2012 15:18:15 ET T: 02/07/2012 15:33:32 ET JOB#: [id number]  cc: Herschell Dimesichard Carroll. Renae GlossWieting, MD,  <Dictator> Serita ShellerErnest B. Maryellen PileEason, MD [facility] Salley ScarletICHARD Carroll Trinidad Petron MD ELECTRONICALLY SIGNED 02/10/2012 17:13

## 2014-06-30 NOTE — Op Note (Signed)
PATIENT NAME:  Philip Carroll, Philip Carroll MR#:  [id number] DATE OF BIRTH:  19-Dec-1955  DATE OF PROCEDURE:  07/01/2011  PREOPERATIVE DIAGNOSIS: Left ring trigger finger.   POSTOPERATIVE DIAGNOSIS: Left ring trigger finger.   PROCEDURE: Left ring trigger finger release.   ANESTHESIA: General.   SURGEON: Leitha SchullerMichael Carroll. Starlyn Droge, MD    DESCRIPTION OF PROCEDURE: The patient was brought to the operating room and after adequate anesthesia was obtained the left arm was prepped and draped in the usual sterile fashion with a tourniquet applied to the upper arm. After prepping and draping in the usual sterile manner, appropriate patient identification and time-out procedures were completed and the tourniquet was raised to 200 mmHg. Incision was made over the A1 pulley approximately 1 cm in length and subcutaneous tissue spread. The A1 pulley was identified and was quite thickened. This was incised with a scalpel followed by scissors to release proximally and then distally. Distally there was a marked thickening and a very tight canal. After release of this, the tendon was inspected and on passive range of motion there was no longer any triggering or catching. There was swelling to the tendon but the tendon itself did not have any fraying or wear. The wound was then irrigated and closed with a subcuticular 4-0 Monocryl and Dermabond. 10 mL of 0.5% Marcaine were infiltrated proximal to the incision for postop analgesia. Sterile dressing of 4 x 4, Webril, and Ace wrap were applied. The patient was sent to the recovery room in stable condition.   ESTIMATED BLOOD LOSS: Minimal.   COMPLICATIONS: None.   SPECIMEN: None.     TOURNIQUET TIME: 13 minutes at 200 mmHg.   ____________________________ Leitha SchullerMichael Carroll. Jaret Coppedge, MD mjm:drc D: 07/01/2011 08:33:04 ET T: 07/01/2011 10:54:53 ET JOB#: [id number]  cc: Leitha SchullerMichael Carroll. Ayinde Swim, MD, <Dictator> Leitha SchullerMICHAEL Carroll Nizar Cutler MD ELECTRONICALLY SIGNED 07/01/2011 11:26
